# Patient Record
Sex: Female | Born: 1935 | Race: White | Hispanic: No | Smoking: Never smoker
Health system: Southern US, Community
[De-identification: ages and names within clinical notes are randomized; demographics above are authoritative.]

## PROBLEM LIST (undated history)

## (undated) DIAGNOSIS — K449 Diaphragmatic hernia without obstruction or gangrene: Secondary | ICD-10-CM

## (undated) DIAGNOSIS — I341 Nonrheumatic mitral (valve) prolapse: Secondary | ICD-10-CM

## (undated) DIAGNOSIS — G44229 Chronic tension-type headache, not intractable: Secondary | ICD-10-CM

## (undated) DIAGNOSIS — F419 Anxiety disorder, unspecified: Secondary | ICD-10-CM

## (undated) DIAGNOSIS — F0390 Unspecified dementia without behavioral disturbance: Secondary | ICD-10-CM

## (undated) HISTORY — DX: Nonrheumatic mitral (valve) prolapse: I34.1

## (undated) HISTORY — DX: Diaphragmatic hernia without obstruction or gangrene: K44.9

## (undated) HISTORY — DX: Chronic tension-type headache, not intractable: G44.229

---

## 1999-11-07 ENCOUNTER — Emergency Department (HOSPITAL_COMMUNITY): Admission: EM | Admit: 1999-11-07 | Discharge: 1999-11-07 | Payer: Self-pay | Admitting: Emergency Medicine

## 1999-11-07 ENCOUNTER — Encounter: Payer: Self-pay | Admitting: Emergency Medicine

## 1999-12-12 ENCOUNTER — Encounter: Admission: RE | Admit: 1999-12-12 | Discharge: 1999-12-12 | Payer: Self-pay | Admitting: Gynecology

## 1999-12-12 ENCOUNTER — Encounter: Payer: Self-pay | Admitting: Gynecology

## 2000-02-29 ENCOUNTER — Encounter: Payer: Self-pay | Admitting: Orthopedic Surgery

## 2000-02-29 ENCOUNTER — Ambulatory Visit (HOSPITAL_COMMUNITY): Admission: RE | Admit: 2000-02-29 | Discharge: 2000-02-29 | Payer: Self-pay | Admitting: Orthopedic Surgery

## 2000-05-02 ENCOUNTER — Encounter: Payer: Self-pay | Admitting: Orthopedic Surgery

## 2000-05-02 ENCOUNTER — Ambulatory Visit (HOSPITAL_COMMUNITY): Admission: RE | Admit: 2000-05-02 | Discharge: 2000-05-02 | Payer: Self-pay | Admitting: Orthopedic Surgery

## 2000-09-05 ENCOUNTER — Encounter: Payer: Self-pay | Admitting: Orthopedic Surgery

## 2000-09-05 ENCOUNTER — Ambulatory Visit (HOSPITAL_COMMUNITY): Admission: RE | Admit: 2000-09-05 | Discharge: 2000-09-05 | Payer: Self-pay | Admitting: Orthopedic Surgery

## 2000-11-11 ENCOUNTER — Other Ambulatory Visit: Admission: RE | Admit: 2000-11-11 | Discharge: 2000-11-11 | Payer: Self-pay | Admitting: Gynecology

## 2000-12-12 ENCOUNTER — Encounter: Admission: RE | Admit: 2000-12-12 | Discharge: 2000-12-12 | Payer: Self-pay | Admitting: Gynecology

## 2000-12-12 ENCOUNTER — Encounter: Payer: Self-pay | Admitting: Gynecology

## 2001-01-21 ENCOUNTER — Ambulatory Visit (HOSPITAL_COMMUNITY): Admission: RE | Admit: 2001-01-21 | Discharge: 2001-01-21 | Payer: Self-pay | Admitting: Gastroenterology

## 2001-02-03 ENCOUNTER — Encounter: Payer: Self-pay | Admitting: Orthopedic Surgery

## 2001-02-11 ENCOUNTER — Encounter: Payer: Self-pay | Admitting: Orthopedic Surgery

## 2001-02-11 ENCOUNTER — Inpatient Hospital Stay (HOSPITAL_COMMUNITY): Admission: RE | Admit: 2001-02-11 | Discharge: 2001-02-16 | Payer: Self-pay | Admitting: Orthopedic Surgery

## 2001-03-30 ENCOUNTER — Encounter: Payer: Self-pay | Admitting: Orthopedic Surgery

## 2001-03-30 ENCOUNTER — Ambulatory Visit (HOSPITAL_COMMUNITY): Admission: RE | Admit: 2001-03-30 | Discharge: 2001-03-30 | Payer: Self-pay | Admitting: Orthopedic Surgery

## 2001-10-14 ENCOUNTER — Encounter: Payer: Self-pay | Admitting: Orthopedic Surgery

## 2001-10-14 ENCOUNTER — Ambulatory Visit (HOSPITAL_COMMUNITY): Admission: RE | Admit: 2001-10-14 | Discharge: 2001-10-14 | Payer: Self-pay | Admitting: Orthopedic Surgery

## 2001-12-03 ENCOUNTER — Encounter: Payer: Self-pay | Admitting: Gynecology

## 2001-12-03 ENCOUNTER — Encounter: Admission: RE | Admit: 2001-12-03 | Discharge: 2001-12-03 | Payer: Self-pay | Admitting: Gynecology

## 2002-04-07 ENCOUNTER — Ambulatory Visit (HOSPITAL_COMMUNITY): Admission: RE | Admit: 2002-04-07 | Discharge: 2002-04-07 | Payer: Self-pay | Admitting: Orthopedic Surgery

## 2002-04-07 ENCOUNTER — Encounter: Payer: Self-pay | Admitting: Orthopedic Surgery

## 2002-12-06 ENCOUNTER — Encounter: Admission: RE | Admit: 2002-12-06 | Discharge: 2002-12-06 | Payer: Self-pay | Admitting: Gynecology

## 2002-12-06 ENCOUNTER — Encounter: Payer: Self-pay | Admitting: Gynecology

## 2003-04-08 ENCOUNTER — Encounter: Payer: Self-pay | Admitting: Geriatric Medicine

## 2003-04-08 ENCOUNTER — Ambulatory Visit (HOSPITAL_COMMUNITY): Admission: RE | Admit: 2003-04-08 | Discharge: 2003-04-08 | Payer: Self-pay | Admitting: Geriatric Medicine

## 2003-11-28 ENCOUNTER — Encounter: Admission: RE | Admit: 2003-11-28 | Discharge: 2003-11-28 | Payer: Self-pay | Admitting: Geriatric Medicine

## 2003-12-29 ENCOUNTER — Other Ambulatory Visit: Admission: RE | Admit: 2003-12-29 | Discharge: 2003-12-29 | Payer: Self-pay | Admitting: Gynecology

## 2004-06-01 ENCOUNTER — Encounter: Admission: RE | Admit: 2004-06-01 | Discharge: 2004-06-01 | Payer: Self-pay | Admitting: Geriatric Medicine

## 2004-06-07 ENCOUNTER — Encounter: Admission: RE | Admit: 2004-06-07 | Discharge: 2004-06-07 | Payer: Self-pay | Admitting: Geriatric Medicine

## 2004-10-05 ENCOUNTER — Encounter: Admission: RE | Admit: 2004-10-05 | Discharge: 2004-10-05 | Payer: Self-pay | Admitting: Gynecology

## 2004-12-03 ENCOUNTER — Encounter: Admission: RE | Admit: 2004-12-03 | Discharge: 2004-12-03 | Payer: Self-pay | Admitting: Gynecology

## 2005-03-15 ENCOUNTER — Encounter: Admission: RE | Admit: 2005-03-15 | Discharge: 2005-03-15 | Payer: Self-pay | Admitting: Gynecology

## 2005-12-12 ENCOUNTER — Encounter: Admission: RE | Admit: 2005-12-12 | Discharge: 2005-12-12 | Payer: Self-pay | Admitting: Geriatric Medicine

## 2006-03-18 ENCOUNTER — Encounter: Admission: RE | Admit: 2006-03-18 | Discharge: 2006-03-18 | Payer: Self-pay | Admitting: Gynecology

## 2006-12-19 ENCOUNTER — Encounter: Admission: RE | Admit: 2006-12-19 | Discharge: 2006-12-19 | Payer: Self-pay | Admitting: Gynecology

## 2007-12-21 ENCOUNTER — Encounter: Admission: RE | Admit: 2007-12-21 | Discharge: 2007-12-21 | Payer: Self-pay | Admitting: Gynecology

## 2008-03-29 ENCOUNTER — Encounter: Admission: RE | Admit: 2008-03-29 | Discharge: 2008-03-29 | Payer: Self-pay | Admitting: Gynecology

## 2008-11-05 ENCOUNTER — Emergency Department (HOSPITAL_COMMUNITY): Admission: EM | Admit: 2008-11-05 | Discharge: 2008-11-05 | Payer: Self-pay | Admitting: Emergency Medicine

## 2008-12-21 ENCOUNTER — Encounter: Admission: RE | Admit: 2008-12-21 | Discharge: 2008-12-21 | Payer: Self-pay | Admitting: Gynecology

## 2009-05-15 ENCOUNTER — Emergency Department (HOSPITAL_COMMUNITY): Admission: EM | Admit: 2009-05-15 | Discharge: 2009-05-15 | Payer: Self-pay | Admitting: Family Medicine

## 2009-12-29 ENCOUNTER — Encounter: Admission: RE | Admit: 2009-12-29 | Discharge: 2009-12-29 | Payer: Self-pay | Admitting: Gynecology

## 2010-12-31 ENCOUNTER — Encounter
Admission: RE | Admit: 2010-12-31 | Discharge: 2010-12-31 | Payer: Self-pay | Source: Home / Self Care | Attending: Gynecology | Admitting: Gynecology

## 2011-01-12 ENCOUNTER — Encounter: Payer: Self-pay | Admitting: Gynecology

## 2011-04-02 LAB — POCT URINALYSIS DIP (DEVICE)
Bilirubin Urine: NEGATIVE
Glucose, UA: 100 mg/dL — AB
Ketones, ur: NEGATIVE mg/dL
Nitrite: POSITIVE — AB
Protein, ur: NEGATIVE mg/dL
Specific Gravity, Urine: <=1.005 (ref 1.005–1.030)
Urobilinogen, UA: 1 mg/dL (ref 0.0–1.0)
pH: 5.5 (ref 5.0–8.0)

## 2011-05-10 NOTE — Op Note (Signed)
[facility]  Patient:    Crystal Allen, Crystal Allen                   MRN: [id number] Proc. Date: 02/11/01 Adm. Date:  [id number] Attending:  Ollen Gross V                           Operative Report  PREOPERATIVE DIAGNOSIS:   Osteoarthritis, left hip.  POSTOPERATIVE DIAGNOSIS:  Osteoarthritis, left hip.  OPERATION:  Left total hip arthroplasty.  SURGEON:  Ollen Gross, M.D.  ASSISTANT:   1. Dooley L. Shela Nevin, P.A.              2. Dorie Rank, P.A.  ANESTHESIA:  General.  ESTIMATED BLOOD LOSS:  700 cc.  DRAIN:  Hemovac x 1.  COMPLICATIONS:  None.  CONDITION:  Stable to recovery room.  BRIEF CLINICAL NOTE:  Crystal Allen is a 77 year old female with severe osteoarthritis of her left hip refractory to nonoperative management.  She presents now for left total hip arthroplasty.  DESCRIPTION OF PROCEDURE:  After successful administration of general anesthetic, the patient was placed in the right lateral decubitus position with left side upheld in hip positioner.  The left lower extremity was isolated from the perineum plastic drapes and prepped and draped in the usual sterile fashion.  Standard posterolateral incision made.  Skin cut with #10 blade, subcutaneous tissue to level of fascia lata which was incised in line with skin incision.  Short external rotators were isolated off the femur and capsulectomy performed.  The hip is then dislocated and center of femoral head marked per preoperative template, and a trial is placed such that the  center of trial ball corresponds with the center of the native femoral head. Osteotomy line is marked on the femoral neck and osteotomy made with an oscillating saw.  The femur is then retracted anteriorly and anterior capsule removed.  The acetabular exposure is obtained and the labrum removed.  Acetabular reaming is then started with a size 45 coursing in increments of 2 to a 47. This had a great rim fit,  and a 48 mm pinnacle sector cuff is impacted into the acetabulum, matching her native anteversion.  It is then transfixed with two domed screws.  The trial 28 mm neutral liner is then placed.  Femoral preparation is initiated first with axial reaming up to 13.5 mm, then proximal reaming up to an 18D, and then it is machined proximally to a small. An 18D small sleeve is placed, resting at the osteotomy line.  The 18 x 13 stem with a 38 + 8 neck is placed with 28 + 0 head.  There is a little bit of soft tissue laxity; thus, a +3 head is placed, and soft tissues are much more appropriate.  I could flex her up to 70, internal rotate 90, flex 90, internal rotate 70.  Initially, she as dislocating with extension and external rotation, but then on further inspection of the anteversion, she was anteverting approximately 40 degrees.  This was taken back to 25 which is just slightly less than her native anteversion.  The hip is then reduced, and there is no further anterior instability.  She has the same stability parameters posteriorly.  The wound is irrigated and trials removed.  The apex hole eliminator is then placed into the acetabular shell, and then the 28 mm, 48 liner is then impacted into the acetabular shell.  The  liner is [employer] cross linked polyethylene.  The 18D small sleeve is then impacted into the proximal femur, and an 18 x 13 stem with a 36 + 8 neck is placed in approximately 25 degrees anteversion which is slightly less than her native anteversion.  This is the same anteversion as the last trial.  Permanent 28 + 3 head is then placed, and she had excellent stability with reduction.  There was no anterior or posterior instability in extremes of motion.  The wound is then copiously irrigated again and short external rotator is reattached to the femur through drill holes.  Fascia lata and fascia over gluteus maximum then closed over one limb of the Hemovac drain with interrupted  #1 Vicryl.  Subcutaneum is closed in two layers with interrupted 0 then 2-0 Vicryl.  Subcuticular is closed with running 4-0 Monocryl.  Incision clean and dry, and a Steri-Strips and bulky sterile dressing applied.  The patient is then awakened and transported to recovery in stable condition. DD:  02/11/01 TD:  02/12/01 Job: [id number]

## 2011-05-10 NOTE — H&P (Signed)
[facility]  Patient:    Crystal Allen, Crystal Allen                      MRN: [id number] Adm. Date:  02/11/01 Attending:  Ollen Gross, M.D. Dictator:   Druscilla Brownie. Shela Nevin, P.A. CC:         Hal T. Pete Glatter, M.D., [facility] @ [place]   History and Physical  DATE OF BIRTH:  [birth year]  CHIEF COMPLAINT:  "Pain in my left hip."  HISTORY OF PRESENT ILLNESS:  This 77 year old female who has had continuing and progressive problems concerning her left hip.  She has developed degenerative osteoarthritis to the left hip.  Unfortunately, she has failed with conservative treatment including nonsteroidal antiinflammatories, rest and some exercise.  She has lost range of motion and can only flex to 90 degrees, 0 internal rotation and 5 degrees external rotation.  X-rays have showed deterioration of the joint space as well.  This is an active lady and relatively young age and she is finding her day to day activity is markedly deteriorated.  It is felt she would benefit from surgical intervention and be admitted for total hip replacement and arthroplasty of the left hip.  PAST MEDICAL HISTORY:  The patient has recently been diagnosed with hypertension.  PAST SURGICAL HISTORY:  Appendectomy in 1949, hysterectomy in 1973, bowel resection secondary to diverticulitis in 1979, right total hip replacement with arthroplasty in 1993, cholecystectomy in 1993 as well.  CURRENT MEDICATIONS: 1. Doxepin 25 mg q.h.s. 2. Premarin 0.625 mg q.d. 3. HCTZ 25 mg q.d. 4. Ocuvite Lutein q.d. 5. Calcium and vitamin D 1200 mg q.d. and vitamin E 1000 mg q.d.  SOCIAL HISTORY:  The patient does not smoke nor drink, lives with her husband at home.  FAMILY HISTORY:  Positive for hypertension in mother and father with lung cancer.  REVIEW OF SYSTEMS:  CENTRAL NERVOUS SYSTEM:  The patient has headaches controlled with medications p.r.n.  CARDIOVASCULAR:  No chest  pain, no angina, no orthopnea.  RESPIRATORY:  No productive cough, hemoptysis or short of breath.  GASTROINTESTINAL:  No nausea, vomiting, melena, or bloody stools. GENITOURINARY:  No discharge, dysuria, hematuria.  MUSCULOSKELETAL:  Left hip as in present illness above.  PHYSICAL EXAMINATION:  VITAL SIGNS:  An alert, cooperative and friendly 77 year old female whose blood pressure is 118/80, respiratory 16, pulse is 70.  HEENT:  Normocephalic.  PERRLA.  EOM intact.  Oropharynx is clear.  CHEST:  Clear to auscultation.  No rhonchi.  No rales.  HEART:  Regular rate and rhythm.  No murmurs are heard.  ABDOMEN:  Soft, nontender.  Liver and spleen not felt.  Bowel sounds positive.  GENITALIA/RECTAL/PELVIC/BREASTS:  Examinations not done.  Not pertinent to present illness.  EXTREMITIES:  The patient has no edema in the lower extremities.  Distal pulses are 2+ and equal.  ADMISSION DIAGNOSES: 1. Left hip degenerative joint disease with osteoarthritis. 2. Hypertension.  PLAN:  The patient will undergo total hip replacement and arthroplasty to her left hip.  No blood has been donated for this procedure.  The patient had been cleared preoperatively by Dr. Ann Maki T. Stoneking.  Her preoperative chest x-ray showed a "vague nodular opacities" overlying the right lateral upper lung and right lung base.  The lung base nodule is consistent with a granuloma in the right upper nodular opacity likely represents a granuloma but is nonspecific. Further evaluation of old films comparison is recommended.  Otherwise,  limited CT is recommended.  We faxed these radiology reports to Dr. Lesia Sago office for his continued approval for surgery.  As mentioned in the report, read by Dr. Leotis Shames T. Hu.  In all probabilities these are old findings.  Would depend on Dr. Alice Rieger expertise concerning need for CT scan. DD:  02/04/01 TD:  02/05/01 Job: [id number]

## 2011-05-10 NOTE — Discharge Summary (Signed)
[facility]  Patient:    Crystal Allen, Crystal Allen                   MRN: [id number] Adm. Date:  [id number] Disc. Date: [id number] Attending:  Loanne Drilling Dictator:   Ralene Bathe, P.A.                           Discharge Summary  ADMISSION DIAGNOSES: 1. End-stage osteoarthritis of left hip. 2. Hypertension.  DISCHARGE DIAGNOSES: 1. End-stage osteoarthritis of left hip. 2. Hypertension. 3. Status post left total hip arthroplasty. 4. Hypertension. 5. Mild hyponatremia, resolved. 6. Mild hypokalemia, resolved. 7. Postoperative hemorrhagic anemia, requiring transfusion.  OPERATIONS:  Left total knee arthroplasty.  Surgeon:  Dr. Homero Fellers Aluisio. Assistant:  Cherly Beach, P.A.Mertha Finders, P.A.C.  Anesthesia: General, with one intraoperative drain placed.  CONSULTATIONS:  None.  HISTORY OF PRESENT ILLNESS:  This is a 77 year old female with end-stage osteoarthritis of the left hip.  She has failed conservative measures and now presents for a left total hip arthroplasty.  The risks and benefits were discussed with the patient, and she was in agreement and wished to proceed.  HOSPITAL COURSE:  The patient was admitted and underwent the above-named procedure and tolerated this well.  All appropriate IV antibiotics and analgesics were provided.  Postoperatively the patient did have mild hyponatremia and hypokalemia.  Medications and fluids were adjusted, and this resolved.  She was able to follow the standard total hip protocol.  She was weaned to p.o. analgesics.  She was monitored touchdown weightbearing with therapy and total hip precautions and progressed well.  She also was begun on Coumadin for DVT and PE prophylaxis.  All in all, the patient progressed well, remained hemodynamically stable.  By February 16, 2001, her potassium was normalized at 4.3, and sodium was normalized at 137.  She was afebrile, INR was therapeutic.  Her incision was  clean and dry.  Neurovascularly she was intact, and she had met all discharge goals and was quite stable for discharge to home.  She did experience a postoperative hemorrhagic anemia and was transfused with two units packed cells with good results and remained hemodynamically stable after that.  LABORATORY DATA:  Admission hemoglobin of 13.6, postoperatively 9, 8.1, and 10.4.  Protimes monitored in chart by pharmacy.  Admission laboratories showed potassium 3.4.  Preoperative sodium 140, postoperatively 133, 134, and 133, with a repeat on February 16, 2001, showing 137.  Postoperatively on her potassium she was 3.8, 3.4, 2.6, with repeats of 3.8 and 4.3.  Urinalysis normal on admission.  Blood type showed A positive.  Two units found transfused in the chart for postoperative hemorrhagic anemia.  EKG showed normal sinus rhythm.  Films showed chest x-ray with vague nodular opacities right middle lung.  Old compression fractures.  Postoperatively left total hip in place.  Admission chest film showed probable granulomas right upper lobe, recommend comparison films.  I do not see comparison films at the time of discharge dictation.  CONDITION ON DISCHARGE:  Stable and improved.  DISPOSITION:  The patient is being discharged to home.  FOLLOW-UP:  In our office two weeks postoperatively, call for a time.  MEDICATIONS:  Prescriptions for Coumadin, Robaxin, and Percocet written for patient.  DIET:  Resume regular diet.  ACTIVITY:  Touchdown weightbearing operative extremity.  WOUND CARE:  Daily dressing changes p.r.n.  Home health PT, R.N. ordered from [place].  DISCHARGE INSTRUCTIONS:  Call for any questions. DD:  02/26/01 TD:  02/27/01 Job: [id number]

## 2011-12-02 ENCOUNTER — Other Ambulatory Visit: Payer: Self-pay | Admitting: Geriatric Medicine

## 2011-12-02 DIAGNOSIS — Z1231 Encounter for screening mammogram for malignant neoplasm of breast: Secondary | ICD-10-CM

## 2012-01-06 ENCOUNTER — Ambulatory Visit
Admission: RE | Admit: 2012-01-06 | Discharge: 2012-01-06 | Disposition: A | Discharge status: Home / Self Care | Payer: Self-pay | Source: Ambulatory Visit | Attending: Geriatric Medicine | Admitting: Geriatric Medicine

## 2012-01-06 DIAGNOSIS — Z1231 Encounter for screening mammogram for malignant neoplasm of breast: Secondary | ICD-10-CM

## 2014-02-13 ENCOUNTER — Emergency Department (HOSPITAL_COMMUNITY)
Admission: EM | Admit: 2014-02-13 | Discharge: 2014-02-13 | Disposition: A | Discharge status: Home / Self Care | Payer: Medicare Other | Attending: Emergency Medicine | Admitting: Emergency Medicine

## 2014-02-13 ENCOUNTER — Emergency Department (HOSPITAL_COMMUNITY): Payer: Medicare Other

## 2014-02-13 ENCOUNTER — Encounter (HOSPITAL_COMMUNITY): Payer: Self-pay | Admitting: Emergency Medicine

## 2014-02-13 DIAGNOSIS — S0993XA Unspecified injury of face, initial encounter: Secondary | ICD-10-CM | POA: Insufficient documentation

## 2014-02-13 DIAGNOSIS — W1809XA Striking against other object with subsequent fall, initial encounter: Secondary | ICD-10-CM | POA: Insufficient documentation

## 2014-02-13 DIAGNOSIS — W010XXA Fall on same level from slipping, tripping and stumbling without subsequent striking against object, initial encounter: Secondary | ICD-10-CM | POA: Insufficient documentation

## 2014-02-13 DIAGNOSIS — W009XXA Unspecified fall due to ice and snow, initial encounter: Secondary | ICD-10-CM

## 2014-02-13 DIAGNOSIS — S22080A Wedge compression fracture of T11-T12 vertebra, initial encounter for closed fracture: Secondary | ICD-10-CM

## 2014-02-13 DIAGNOSIS — Y9289 Other specified places as the place of occurrence of the external cause: Secondary | ICD-10-CM | POA: Insufficient documentation

## 2014-02-13 DIAGNOSIS — M545 Low back pain, unspecified: Secondary | ICD-10-CM

## 2014-02-13 DIAGNOSIS — F411 Generalized anxiety disorder: Secondary | ICD-10-CM | POA: Insufficient documentation

## 2014-02-13 DIAGNOSIS — Y9389 Activity, other specified: Secondary | ICD-10-CM | POA: Insufficient documentation

## 2014-02-13 DIAGNOSIS — S199XXA Unspecified injury of neck, initial encounter: Secondary | ICD-10-CM

## 2014-02-13 DIAGNOSIS — S22009A Unspecified fracture of unspecified thoracic vertebra, initial encounter for closed fracture: Secondary | ICD-10-CM | POA: Insufficient documentation

## 2014-02-13 HISTORY — DX: Anxiety disorder, unspecified: F41.9

## 2014-02-13 LAB — BASIC METABOLIC PANEL
BUN: 14 mg/dL (ref 6–23)
CO2: 23 mEq/L (ref 19–32)
Calcium: 9.5 mg/dL (ref 8.4–10.5)
Chloride: 103 mEq/L (ref 96–112)
Creatinine, Ser: 1 mg/dL (ref 0.50–1.10)
GFR calc Af Amer: 58 mL/min — ABNORMAL LOW (ref >90)
GFR calc non Af Amer: 50 mL/min — ABNORMAL LOW (ref >90)
Glucose, Bld: 108 mg/dL — ABNORMAL HIGH (ref 70–99)
Potassium: 3.6 mEq/L — ABNORMAL LOW (ref 3.7–5.3)
Sodium: 143 mEq/L (ref 137–147)

## 2014-02-13 LAB — CBC
HCT: 39.1 % (ref 36.0–46.0)
Hemoglobin: 13.4 g/dL (ref 12.0–15.0)
MCH: 31.2 pg (ref 26.0–34.0)
MCHC: 34.3 g/dL (ref 30.0–36.0)
MCV: 90.9 fL (ref 78.0–100.0)
Platelets: 139 10*3/uL — ABNORMAL LOW (ref 150–400)
RBC: 4.3 MIL/uL (ref 3.87–5.11)
RDW: 13 % (ref 11.5–15.5)
WBC: 9.5 10*3/uL (ref 4.0–10.5)

## 2014-02-13 MED ORDER — MORPHINE SULFATE 4 MG/ML IJ SOLN: 4.0000 mg | Freq: Once | INTRAMUSCULAR | Status: DC

## 2014-02-13 MED ORDER — HYDROMORPHONE HCL PF 1 MG/ML IJ SOLN: 1.0000 mg | Freq: Once | INTRAMUSCULAR | Status: DC

## 2014-02-13 MED ORDER — TRAMADOL HCL 50 MG PO TABS: 50.0000 mg | ORAL_TABLET | Freq: Four times a day (QID) | ORAL | Status: DC | PRN

## 2014-02-13 MED ORDER — OXYCODONE-ACETAMINOPHEN 5-325 MG PO TABS
1.0000 | ORAL_TABLET | Freq: Once | ORAL | Status: AC
  Administered 2014-02-13: 1 via ORAL
  Filled 2014-02-13: qty 1

## 2014-02-13 NOTE — ED Notes (Signed)
Patient ambulatory steady gait stated feels better with pain medication.
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
## 2014-02-13 NOTE — ED Notes (Signed)
Phlebotomy at bedside.
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
## 2014-02-13 NOTE — Discharge Instructions (Signed)
Take pain medication only as prescribed and only for severe pain.  You may take acetaminophen (tylenol) when not taking pain medication. Pain medication may make you drowsy, do not drive while taking. Follow up with your family doctor later this week for recheck of symptoms. Return to ER if you develop increased difficulty walking, difficulty going to the bathroom, or other new or worsening symptoms develop.

## 2014-02-17 NOTE — ED Provider Notes (Signed)
Medical screening examination/treatment/procedure(s) were performed by non-physician practitioner and as supervising physician I was immediately available for consultation/collaboration.  EKG Interpretation    Date/Time:  Sunday February 13 2014 13:49:55 EST Ventricular Rate:  70 PR Interval:  140 QRS Duration: 78 QT Interval:  394 QTC Calculation: 425 R Axis:   59 Text Interpretation:  Sinus rhythm Multiple premature complexes, vent  Anterior infarct, old Lead(s) aVL were not used for morphology analysis ED PHYSICIAN INTERPRETATION AVAILABLE IN [facility] Confirmed by TEST, RECORD ([id number]) on 02/15/2014 7:30:41 AM             Raeford RazorStephen Landy Mace, MD 02/17/14 [phone]

## 2014-03-02 ENCOUNTER — Ambulatory Visit
Admission: RE | Admit: 2014-03-02 | Discharge: 2014-03-02 | Disposition: A | Discharge status: Home / Self Care | Payer: Medicare Other | Source: Ambulatory Visit | Attending: Geriatric Medicine | Admitting: Geriatric Medicine

## 2014-03-02 ENCOUNTER — Other Ambulatory Visit: Payer: Self-pay | Admitting: Geriatric Medicine

## 2014-03-02 DIAGNOSIS — M549 Dorsalgia, unspecified: Secondary | ICD-10-CM

## 2014-03-23 ENCOUNTER — Other Ambulatory Visit: Payer: Self-pay | Admitting: Geriatric Medicine

## 2014-03-23 DIAGNOSIS — R413 Other amnesia: Secondary | ICD-10-CM

## 2014-03-26 ENCOUNTER — Ambulatory Visit
Admission: RE | Admit: 2014-03-26 | Discharge: 2014-03-26 | Disposition: A | Discharge status: Home / Self Care | Payer: Medicare Other | Source: Ambulatory Visit | Attending: Geriatric Medicine | Admitting: Geriatric Medicine

## 2014-03-26 DIAGNOSIS — R413 Other amnesia: Secondary | ICD-10-CM

## 2014-03-28 MED ORDER — GADOBENATE DIMEGLUMINE 529 MG/ML IV SOLN
10.0000 mL | Freq: Once | INTRAVENOUS | Status: AC | PRN
  Administered 2014-03-28: 10 mL via INTRAVENOUS

## 2016-01-02 IMAGING — CR DG PELVIS 1-2V
2 series · 2 of 2 positions shown · non-contrast
Comparison: None

CLINICAL DATA: Fell on ice, low back and bilateral hip pain

EXAM:
PELVIS - 1-2 VIEW

[t pelvis ap (1 of 2)]
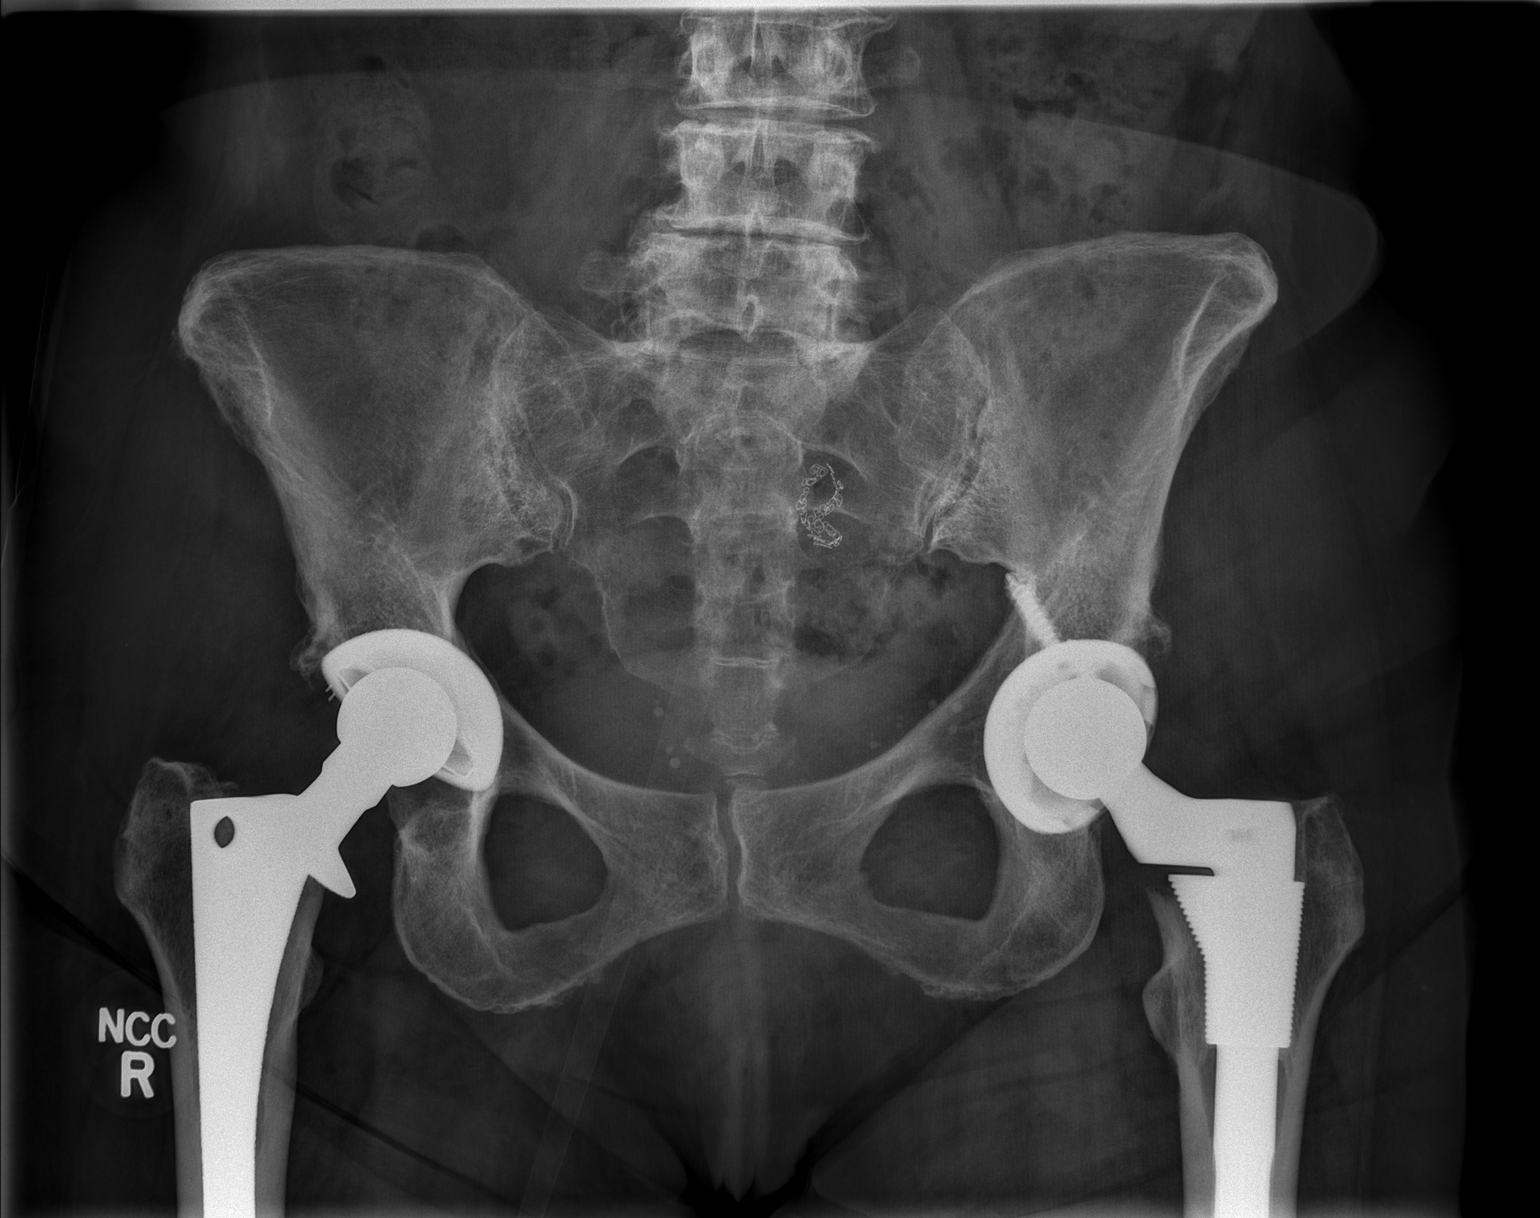

[t pelvis ap (2 of 2)]
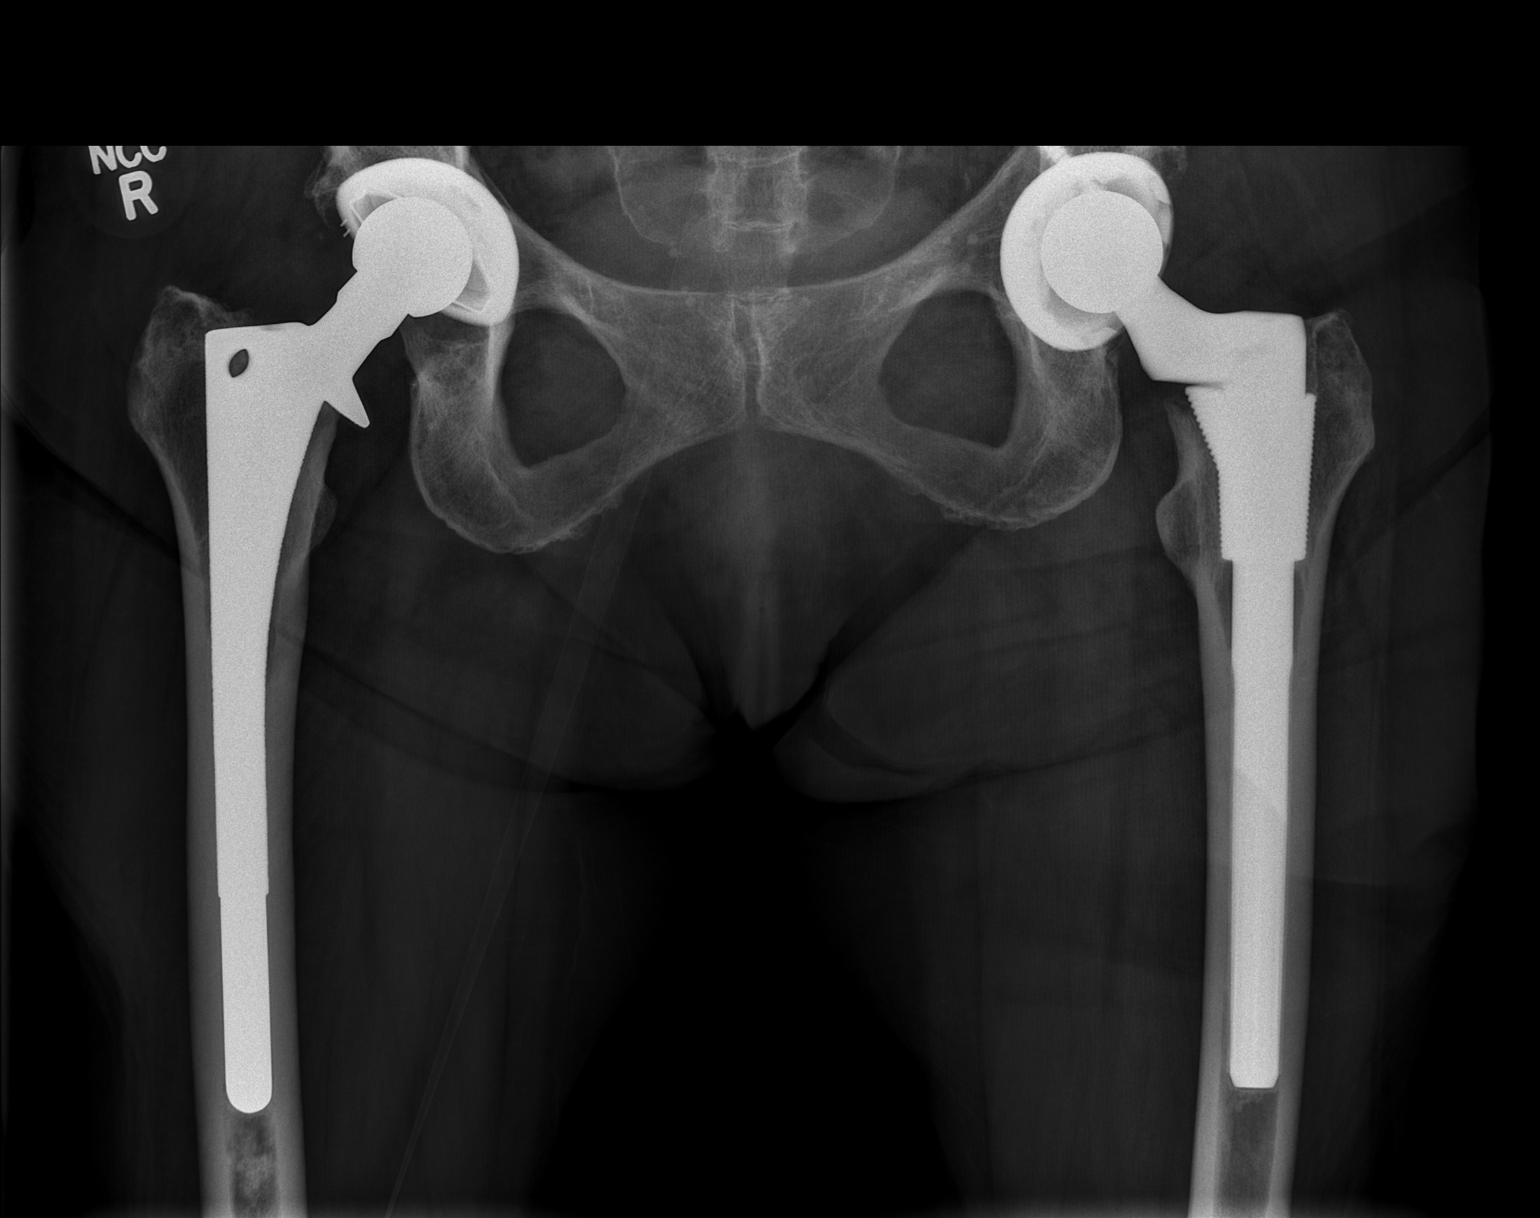

[2 of 2 positions shown; findings below may reference images not displayed]

FINDINGS: Diffuse osseous demineralization.

Bilateral hip prostheses.

Symmetric SI joints.

No acute fracture, dislocation or bone destruction.

Pelvis appears intact.

Numerous pelvic phleboliths.

Bowel anastomosis in pelvis.
IMPRESSION: Bilateral hip prostheses.

Osseous demineralization.

No acute bony findings.

## 2016-01-02 IMAGING — CT CT CERVICAL SPINE W/O CM
4 of 7 series · 12 of 33 positions shown, 14 images · non-contrast
Comparison: None.

CLINICAL DATA: Fall

EXAM:
CT HEAD WITHOUT CONTRAST
CT CERVICAL SPINE WITHOUT CONTRAST
TECHNIQUE: Multidetector CT imaging of the head and cervical spine was
performed following the standard protocol without intravenous
contrast. Multiplanar CT image reconstructions of the cervical spine
were also generated.

[Series 5: c_spine 2.0 i40s 3 · axial · 0.27mm/px · z∈[+423,+481]mm · 2 of 87 slices shown, 3 images]
[im 29/87  soft-tissue]
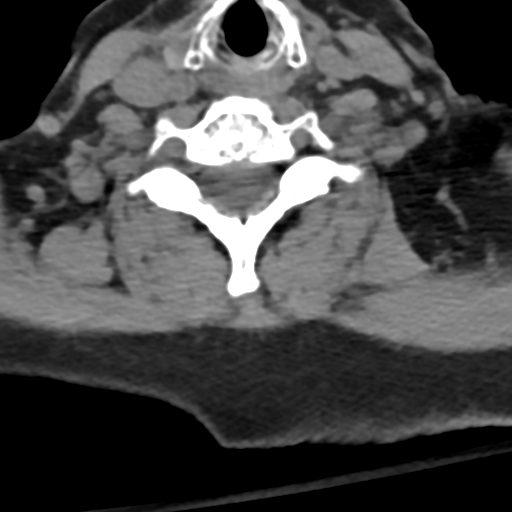
[im 29/87  bone]
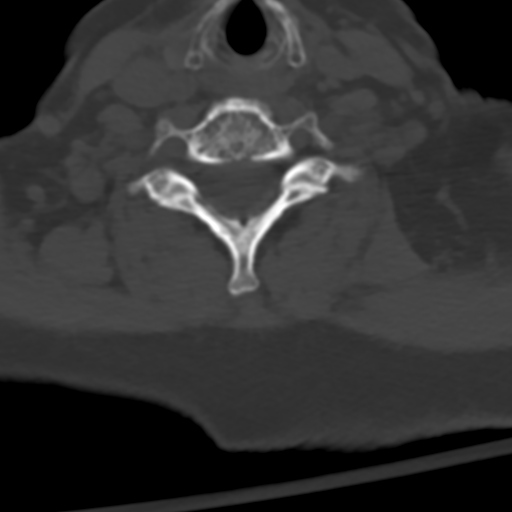
[im 58/87  bone]
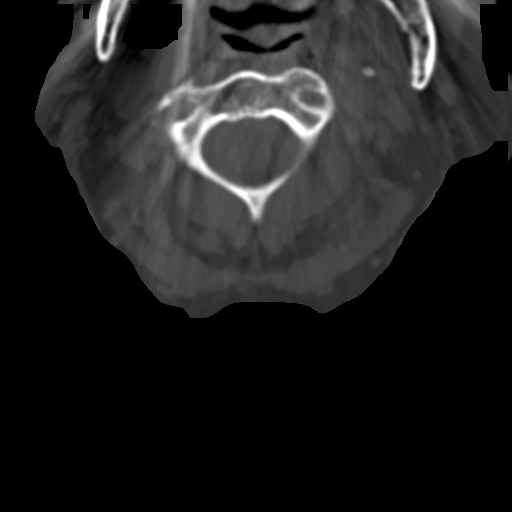

[Series 7: coronals · coronal · 0.24mm/px · 3 of 42 slices shown]
[im 9/42  bone]
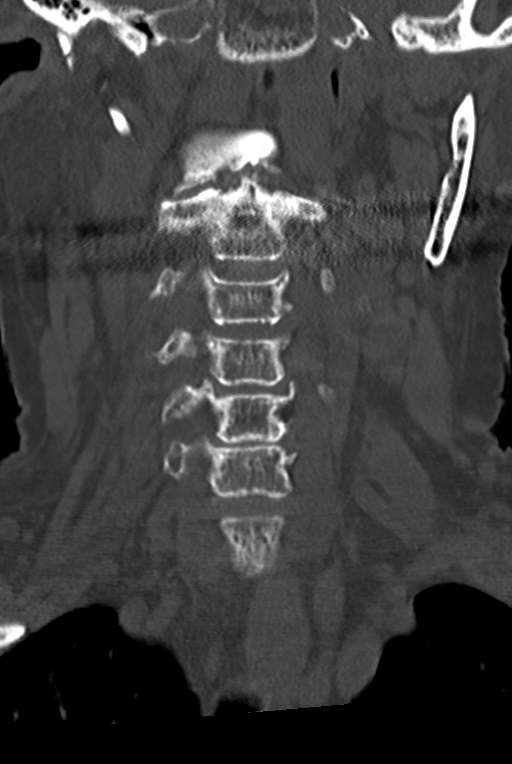
[im 17/42  bone]
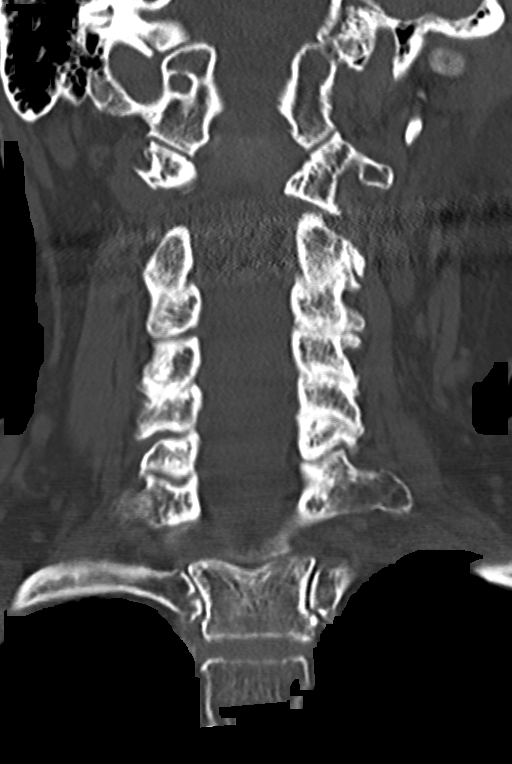
[im 25/42  bone]
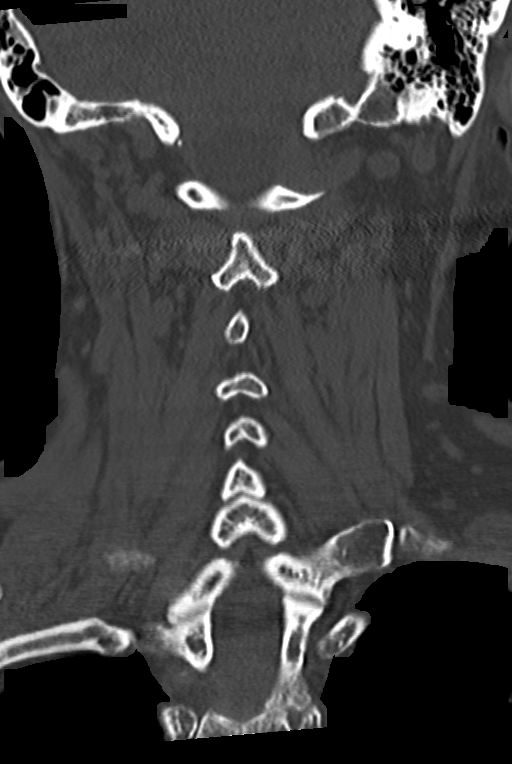

[Series 8: sagittals · sagittal · 0.22mm/px · 5 of 57 slices shown, 6 images]
[im 19/57  bone]
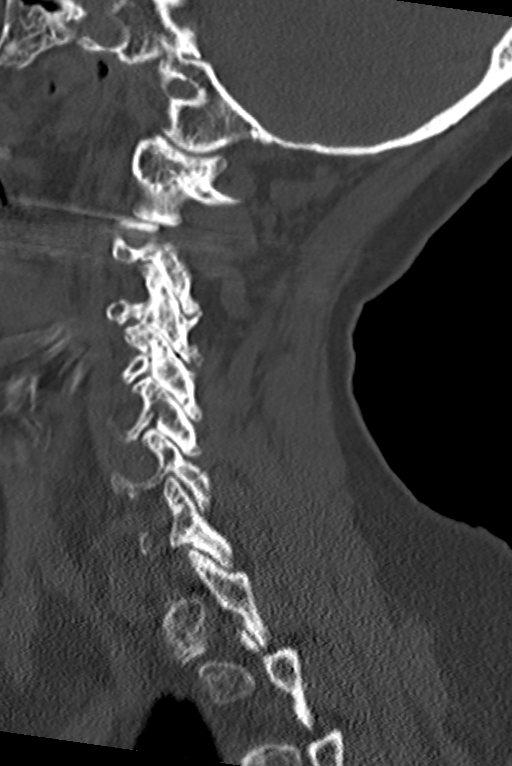
[im 24/57  bone]
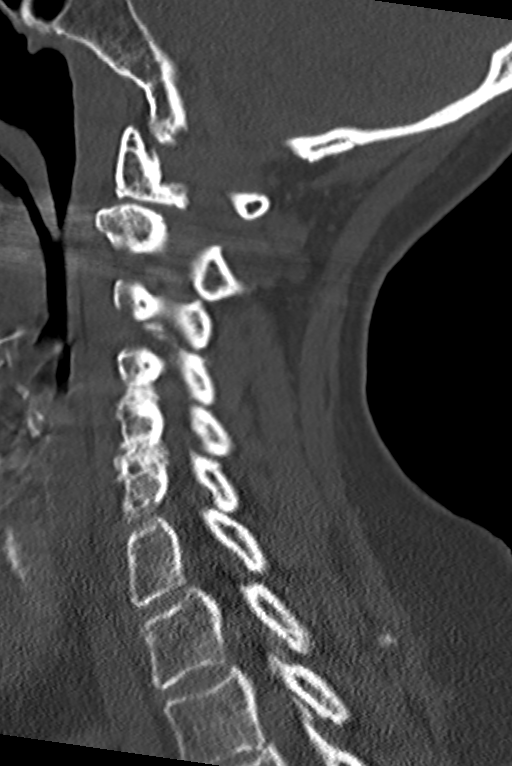
[im 29/57  soft-tissue]
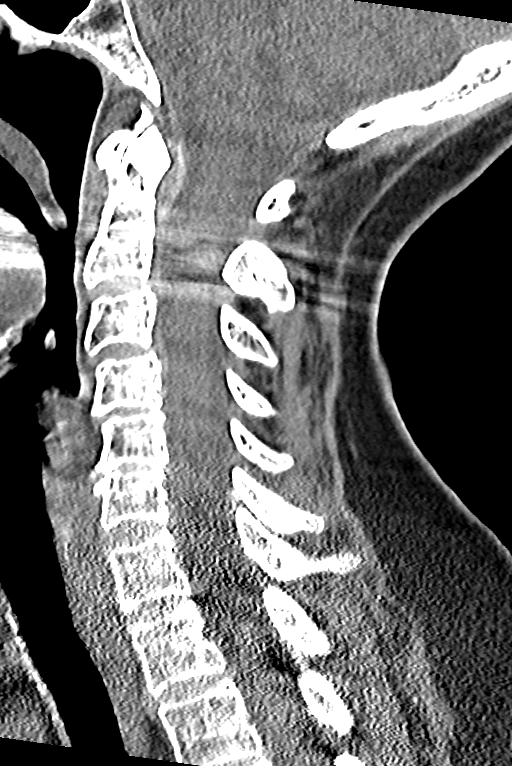
[im 29/57  bone]
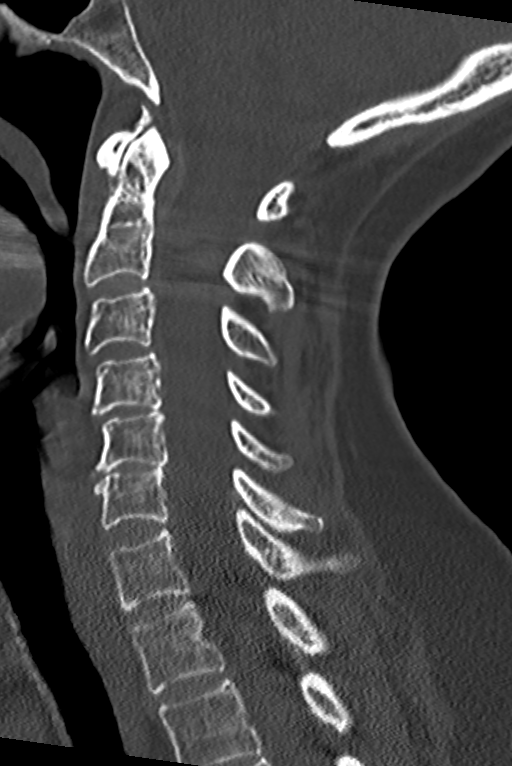
[im 33/57  bone]
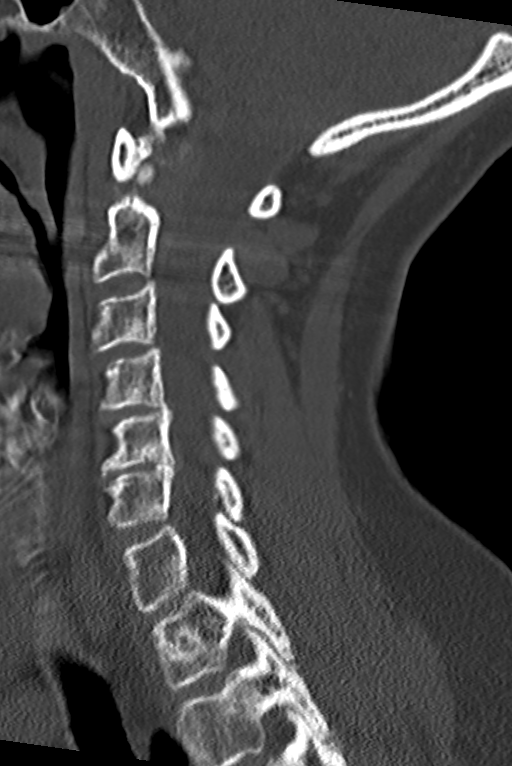
[im 38/57  bone]
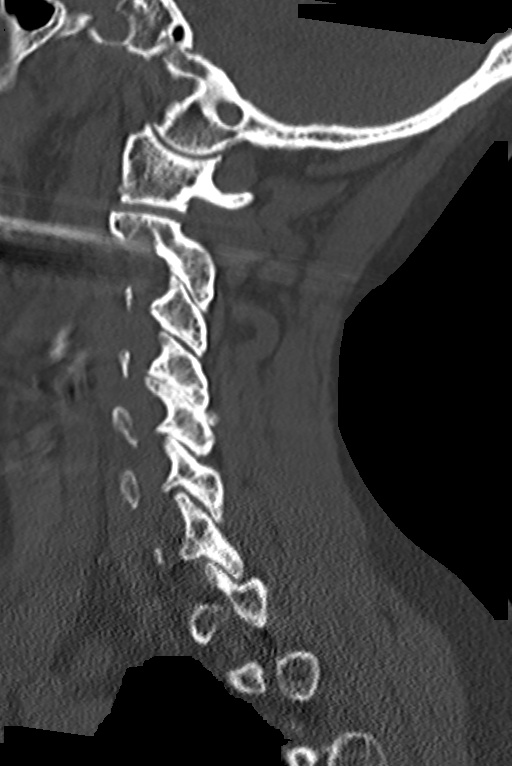

[Series 9: orthogonals · axial · 0.20mm/px · z∈[+412,+466]mm · 2 of 85 slices shown]
[im 29/85  bone]
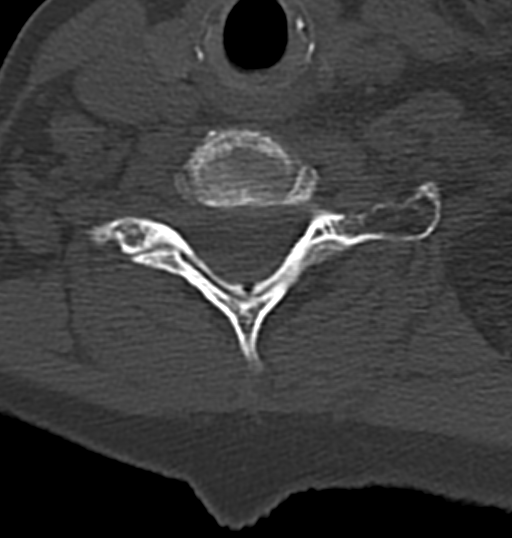
[im 57/85  bone]
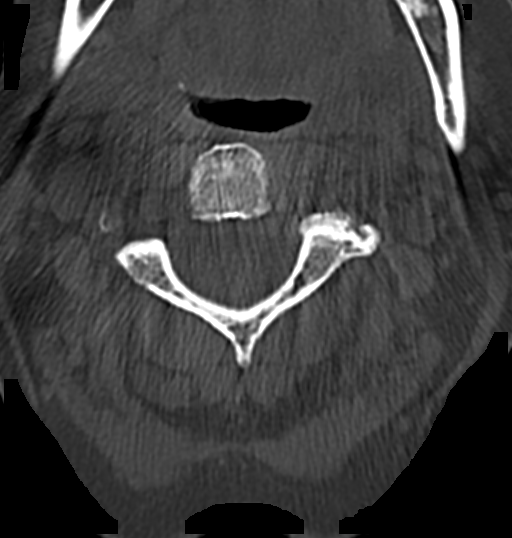

[12 of 33 positions shown; findings below may reference images not displayed]

FINDINGS: CT HEAD FINDINGS

Extensive chronic ischemic changes in the periventricular white
matter. Global atrophy. No mass effect, midline shift, or acute
intracranial hemorrhage. There is a small amount of fluid in the
right mastoid air cells. Cranium is intact.

CT CERVICAL SPINE FINDINGS

No fracture. No dislocation. Degenerative changes are scattered
throughout the cervical spine. No obvious soft tissue injury. No
obvious spinal hematoma. Lung apices are clear.
IMPRESSION: No acute intracranial injury. There is a small amount of fluid in
the right mastoid air cells.

Degenerative changes in the cervical spine without evidence of
injury.

## 2018-04-30 ENCOUNTER — Encounter (HOSPITAL_COMMUNITY): Payer: Self-pay | Admitting: Emergency Medicine

## 2018-04-30 ENCOUNTER — Other Ambulatory Visit: Payer: Self-pay

## 2018-04-30 ENCOUNTER — Emergency Department (HOSPITAL_COMMUNITY): Payer: Medicare Other

## 2018-04-30 ENCOUNTER — Emergency Department (HOSPITAL_COMMUNITY)
Admission: EM | Admit: 2018-04-30 | Discharge: 2018-04-30 | Disposition: A | Discharge status: Home / Self Care | Payer: Medicare Other | Attending: Emergency Medicine | Admitting: Emergency Medicine

## 2018-04-30 DIAGNOSIS — F419 Anxiety disorder, unspecified: Secondary | ICD-10-CM

## 2018-04-30 DIAGNOSIS — Z79899 Other long term (current) drug therapy: Secondary | ICD-10-CM | POA: Diagnosis not present

## 2018-04-30 DIAGNOSIS — R0602 Shortness of breath: Secondary | ICD-10-CM | POA: Diagnosis present

## 2018-04-30 DIAGNOSIS — F0390 Unspecified dementia without behavioral disturbance: Secondary | ICD-10-CM | POA: Diagnosis not present

## 2018-04-30 DIAGNOSIS — R0600 Dyspnea, unspecified: Secondary | ICD-10-CM | POA: Diagnosis not present

## 2018-04-30 HISTORY — DX: Unspecified dementia, unspecified severity, without behavioral disturbance, psychotic disturbance, mood disturbance, and anxiety: F03.90

## 2018-04-30 LAB — TROPONIN I

## 2018-04-30 LAB — COMPREHENSIVE METABOLIC PANEL
ALBUMIN: 4.1 g/dL (ref 3.5–5.0)
ALT: 23 U/L (ref 14–54)
ANION GAP: 12 (ref 5–15)
AST: 34 U/L (ref 15–41)
Alkaline Phosphatase: 91 U/L (ref 38–126)
BUN: 17 mg/dL (ref 6–20)
CHLORIDE: 106 mmol/L (ref 101–111)
CO2: 24 mmol/L (ref 22–32)
Calcium: 9.5 mg/dL (ref 8.9–10.3)
Creatinine, Ser: 1.15 mg/dL — ABNORMAL HIGH (ref 0.44–1.00)
GFR calc Af Amer: 47 mL/min — ABNORMAL LOW (ref >60)
GFR, EST NON AFRICAN AMERICAN: 41 mL/min — AB (ref >60)
Glucose, Bld: 99 mg/dL (ref 65–99)
Potassium: 3 mmol/L — ABNORMAL LOW (ref 3.5–5.1)
Sodium: 142 mmol/L (ref 135–145)
Total Bilirubin: 0.9 mg/dL (ref 0.3–1.2)
Total Protein: 7.8 g/dL (ref 6.5–8.1)

## 2018-04-30 LAB — CBC WITH DIFFERENTIAL/PLATELET
Basophils Absolute: 0 10*3/uL (ref 0.0–0.1)
Basophils Relative: 0 %
Eosinophils Absolute: 0.2 10*3/uL (ref 0.0–0.7)
Eosinophils Relative: 3 %
HCT: 45.8 % (ref 36.0–46.0)
Hemoglobin: 15.1 g/dL — ABNORMAL HIGH (ref 12.0–15.0)
Lymphocytes Relative: 26 %
Lymphs Abs: 2.1 10*3/uL (ref 0.7–4.0)
MCH: 31 pg (ref 26.0–34.0)
MCHC: 33 g/dL (ref 30.0–36.0)
MCV: 94 fL (ref 78.0–100.0)
MONOS PCT: 9 %
Monocytes Absolute: 0.7 10*3/uL (ref 0.1–1.0)
NEUTROS ABS: 5 10*3/uL (ref 1.7–7.7)
Neutrophils Relative %: 62 %
PLATELETS: 201 10*3/uL (ref 150–400)
RBC: 4.87 MIL/uL (ref 3.87–5.11)
RDW: 13 % (ref 11.5–15.5)
WBC: 8.1 10*3/uL (ref 4.0–10.5)

## 2018-04-30 LAB — D-DIMER, QUANTITATIVE: D-Dimer, Quant: 2.11 ug/mL-FEU — ABNORMAL HIGH (ref 0.00–0.50)

## 2018-04-30 MED ORDER — LORAZEPAM 2 MG/ML IJ SOLN: 0.5000 mg | Freq: Once | INTRAMUSCULAR | Status: DC

## 2018-04-30 MED ORDER — LORAZEPAM 0.5 MG PO TABS: 1.0000 mg | ORAL_TABLET | Freq: Three times a day (TID) | ORAL | 0 refills | Status: AC | PRN

## 2018-04-30 MED ORDER — POTASSIUM CHLORIDE CRYS ER 20 MEQ PO TBCR
40.0000 meq | EXTENDED_RELEASE_TABLET | Freq: Once | ORAL | Status: AC
  Administered 2018-04-30: 40 meq via ORAL
  Filled 2018-04-30: qty 2

## 2018-04-30 NOTE — ED Triage Notes (Signed)
Pt brought in by EMS per caregiver pt was eating and c/o chest pain and SOB. Upon EMS arrival pt was very anxious and hyperventilating, denies any pain at this time. Hx of dementia.

## 2018-04-30 NOTE — ED Provider Notes (Signed)
[facility] EMERGENCY DEPARTMENT Provider Note   CSN: [id number] Arrival date & time: 04/30/18  1326     History   Chief Complaint Chief Complaint  Patient presents with  . Shortness of Breath    anxiety    HPI Crystal Allen is a 84 y.o. female.  Patient has been short of breath today.  Patient has a history of significant dementia and anxiety.  The history is provided by a relative. No language interpreter was used.  Shortness of Breath  This is a recurrent problem. The problem occurs continuously.The current episode started 2 days ago. The problem has not changed since onset.Pertinent negatives include no fever. It is unknown what precipitated the problem. Risk factors: Dementia. She has tried nothing for the symptoms. The treatment provided no relief. She has had prior hospitalizations. She has had prior ED visits. She has had no prior ICU admissions. Associated medical issues do not include pneumonia.    Past Medical History:  Diagnosis Date  . Anxiety   . Chronic tension headache   . Dementia   . Hiatal hernia   . Mitral valve prolapse     There are no active problems to display for this patient.   History reviewed. No pertinent surgical history.   OB History   None      Home Medications    Prior to Admission medications   Medication Sig Start Date End Date Taking? Authorizing Provider  amLODipine (NORVASC) 5 MG tablet Take 5 mg by mouth daily.   Yes [provider]  donepezil (ARICEPT) 10 MG tablet Take 1 tablet by mouth daily. 04/05/18  Yes [provider]  doxepin (SINEQUAN) 10 MG capsule Take 1 capsule by mouth daily. 04/05/18  Yes [provider]  MELATONIN PO Take 1 tablet by mouth at bedtime as needed.   Yes [provider]  memantine (NAMENDA) 10 MG tablet Take 1 tablet by mouth 2 (two) times daily. 04/05/18  Yes [provider]  LORazepam (ATIVAN) 0.5 MG tablet Take 2 tablets (1 mg total) by mouth every 8  (eight) hours as needed for anxiety. 04/30/18   Bethann Berkshire, MD    Family History History reviewed. No pertinent family history.  Social History Social History   Tobacco Use  . Smoking status: Never Smoker  . Smokeless tobacco: Never Used  Substance Use Topics  . Alcohol use: Not on file  . Drug use: Not on file     Allergies   Patient has no known allergies.   Review of Systems Review of Systems  Unable to perform ROS: Dementia  Constitutional: Negative for fever.  Respiratory: Positive for shortness of breath.      Physical Exam Updated Vital Signs BP 129/88 (BP Location: Left Arm)   Pulse 89   Temp 97.7 F (36.5 C) (Oral)   Resp 20   Wt 77.1 kg (170 lb)   SpO2 100%   BMI 31.09 kg/m   Physical Exam  Constitutional: She appears well-developed.  HENT:  Head: Normocephalic.  Eyes: Conjunctivae and EOM are normal. No scleral icterus.  Neck: Neck supple. No thyromegaly present.  Cardiovascular: Normal rate and regular rhythm. Exam reveals no gallop and no friction rub.  No murmur heard. Pulmonary/Chest: No stridor. She has no wheezes. She has no rales. She exhibits no tenderness.  Tachypnea  Abdominal: She exhibits no distension. There is no tenderness. There is no rebound.  Musculoskeletal: Normal range of motion. She exhibits no edema.  Lymphadenopathy:  She has no cervical adenopathy.  Neurological: She is alert. She exhibits normal muscle tone. Coordination normal.  Oriented to person only  Skin: No rash noted. No erythema.  Psychiatric:  Anxious     ED Treatments / Results  Labs (all labs ordered are listed, but only abnormal results are displayed) Labs Reviewed  CBC WITH DIFFERENTIAL/PLATELET - Abnormal; Notable for the following components:      Result Value   Hemoglobin 15.1 (*)    All other components within normal limits  COMPREHENSIVE METABOLIC PANEL - Abnormal; Notable for the following components:   Potassium 3.0 (*)     Creatinine, Ser 1.15 (*)    GFR calc non Af Amer 41 (*)    GFR calc Af Amer 47 (*)    All other components within normal limits  D-DIMER, QUANTITATIVE (NOT AT [place]) - Abnormal; Notable for the following components:   D-Dimer, Quant 2.11 (*)    All other components within normal limits  TROPONIN I    EKG None  Radiology No results found.  Procedures Procedures (including critical care time)  Medications Ordered in ED Medications  LORazepam (ATIVAN) injection 0.5 mg (0.5 mg Intravenous Not Given 04/30/18 1357)     Initial Impression / Assessment and Plan / ED Course  I have reviewed the triage vital signs and the nursing notes.  Pertinent labs & imaging results that were available during my care of the patient were reviewed by me and considered in my medical decision making (see chart for details).     Patient with tachypnea and anxiety.  Labs unremarkable except for elevated d-dimer.  And mildly low potassium.  Patient will get a CT angios to rule out PE.  Pt left ama  Final Clinical Impressions(s) / ED Diagnoses   Final diagnoses:  None    ED Discharge Orders        Ordered    LORazepam (ATIVAN) 0.5 MG tablet  Every 8 hours PRN     04/30/18 1505       Bethann Berkshire, MD 05/06/18 1549

## 2018-04-30 NOTE — ED Notes (Signed)
Pt family refused CT. Stated that they wanted to consult their MD in [place].

## 2018-04-30 NOTE — Discharge Instructions (Addendum)
Follow-up with your family doctor next week for recheck. 

## 2018-04-30 NOTE — ED Provider Notes (Signed)
  Physical Exam  BP 129/88 (BP Location: Left Arm)   Pulse 89   Temp 97.7 F (36.5 C) (Oral)   Resp 20   Wt 77.1 kg (170 lb)   SpO2 100%   BMI 31.09 kg/m   Physical Exam  ED Course/Procedures     Procedures  MDM  Received signout from Dr. Estell Harpin.  Shortness of breath.  Resolved after Ativan.  History of anxiety with her dementia.  However did have d-dimer positive.  Plan was to get chest x-ray followed by CT angiography.  However the patient's daughter now does not want this.  States that her primary care doctor, Dr. Pete Glatter can follow-up with anything he wants.  Patient is not dyspneic now normal vital signs.  Despite the positive d-dimer I think it is safe for discharge with the shared decision-making       Benjiman Core, MD 04/30/18 1621

## 2019-11-23 DEATH — deceased
